# Patient Record
Sex: Male | Born: 1965 | Race: White | Hispanic: No | Marital: Single | Smoking: Current every day smoker
Health system: Southern US, Community
[De-identification: ages and names within clinical notes are randomized; demographics above are authoritative.]

## PROBLEM LIST (undated history)

## (undated) HISTORY — PX: BACK SURGERY: SHX140

---

## 2014-05-20 ENCOUNTER — Emergency Department (HOSPITAL_COMMUNITY)
Admission: EM | Admit: 2014-05-20 | Discharge: 2014-05-20 | Disposition: A | Discharge status: Home / Self Care | Payer: No Typology Code available for payment source | Attending: Emergency Medicine | Admitting: Emergency Medicine

## 2014-05-20 ENCOUNTER — Emergency Department (HOSPITAL_COMMUNITY): Payer: No Typology Code available for payment source

## 2014-05-20 ENCOUNTER — Encounter (HOSPITAL_COMMUNITY): Payer: Self-pay

## 2014-05-20 DIAGNOSIS — S0001XA Abrasion of scalp, initial encounter: Secondary | ICD-10-CM | POA: Insufficient documentation

## 2014-05-20 DIAGNOSIS — Z720 Tobacco use: Secondary | ICD-10-CM | POA: Diagnosis not present

## 2014-05-20 DIAGNOSIS — Y998 Other external cause status: Secondary | ICD-10-CM | POA: Insufficient documentation

## 2014-05-20 DIAGNOSIS — S3992XA Unspecified injury of lower back, initial encounter: Secondary | ICD-10-CM | POA: Insufficient documentation

## 2014-05-20 DIAGNOSIS — Z23 Encounter for immunization: Secondary | ICD-10-CM | POA: Insufficient documentation

## 2014-05-20 DIAGNOSIS — Y9389 Activity, other specified: Secondary | ICD-10-CM | POA: Diagnosis not present

## 2014-05-20 DIAGNOSIS — Z9889 Other specified postprocedural states: Secondary | ICD-10-CM | POA: Diagnosis not present

## 2014-05-20 DIAGNOSIS — S161XXA Strain of muscle, fascia and tendon at neck level, initial encounter: Secondary | ICD-10-CM | POA: Diagnosis not present

## 2014-05-20 DIAGNOSIS — S199XXA Unspecified injury of neck, initial encounter: Secondary | ICD-10-CM | POA: Diagnosis present

## 2014-05-20 DIAGNOSIS — S59902A Unspecified injury of left elbow, initial encounter: Secondary | ICD-10-CM | POA: Insufficient documentation

## 2014-05-20 DIAGNOSIS — Y92410 Unspecified street and highway as the place of occurrence of the external cause: Secondary | ICD-10-CM | POA: Insufficient documentation

## 2014-05-20 MED ORDER — ACETAMINOPHEN 500 MG PO TABS
1000.0000 mg | ORAL_TABLET | Freq: Once | ORAL | Status: AC
  Administered 2014-05-20: 1000 mg via ORAL
  Filled 2014-05-20: qty 2

## 2014-05-20 MED ORDER — TETANUS-DIPHTH-ACELL PERTUSSIS 5-2.5-18.5 LF-MCG/0.5 IM SUSP
0.5000 mL | Freq: Once | INTRAMUSCULAR | Status: AC
  Administered 2014-05-20: 0.5 mL via INTRAMUSCULAR
  Filled 2014-05-20: qty 0.5
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
## 2014-05-20 NOTE — Discharge Instructions (Signed)
Cervical Sprain °A cervical sprain is an injury in the neck in which the strong, fibrous tissues (ligaments) that connect your neck bones stretch or tear. Cervical sprains can range from mild to severe. Severe cervical sprains can cause the neck vertebrae to be unstable. This can lead to damage of the spinal cord and can result in serious nervous system problems. The amount of time it takes for a cervical sprain to get better depends on the cause and extent of the injury. Most cervical sprains heal in 1 to 3 weeks. °CAUSES  °Severe cervical sprains may be caused by:  °· Contact sport injuries (such as from football, rugby, wrestling, hockey, auto racing, gymnastics, diving, martial arts, or boxing).   °· Motor vehicle collisions.   °· Whiplash injuries. This is an injury from a sudden forward and backward whipping movement of the head and neck.  °· Falls.   °Mild cervical sprains may be caused by:  °· Being in an awkward position, such as while cradling a telephone between your ear and shoulder.   °· Sitting in a chair that does not offer proper support.   °· Working at a poorly designed computer station.   °· Looking up or down for long periods of time.   °SYMPTOMS  °· Pain, soreness, stiffness, or a burning sensation in the front, back, or sides of the neck. This discomfort may develop immediately after the injury or slowly, 24 hours or more after the injury.   °· Pain or tenderness directly in the middle of the back of the neck.   °· Shoulder or upper back pain.   °· Limited ability to move the neck.   °· Headache.   °· Dizziness.   °· Weakness, numbness, or tingling in the hands or arms.   °· Muscle spasms.   °· Difficulty swallowing or chewing.   °· Tenderness and swelling of the neck.   °DIAGNOSIS  °Most of the time your health care provider can diagnose a cervical sprain by taking your history and doing a physical exam. Your health care provider will ask about previous neck injuries and any known neck  problems, such as arthritis in the neck. X-rays may be taken to find out if there are any other problems, such as with the bones of the neck. Other tests, such as a CT scan or MRI, may also be needed.  °TREATMENT  °Treatment depends on the severity of the cervical sprain. Mild sprains can be treated with rest, keeping the neck in place (immobilization), and pain medicines. Severe cervical sprains are immediately immobilized. Further treatment is done to help with pain, muscle spasms, and other symptoms and may include: °· Medicines, such as pain relievers, numbing medicines, or muscle relaxants.   °· Physical therapy. This may involve stretching exercises, strengthening exercises, and posture training. Exercises and improved posture can help stabilize the neck, strengthen muscles, and help stop symptoms from returning.   °HOME CARE INSTRUCTIONS  °· Put ice on the injured area.   °¨ Put ice in a plastic bag.   °¨ Place a towel between your skin and the bag.   °¨ Leave the ice on for 15-20 minutes, 3-4 times a day.   °· If your injury was severe, you may have been given a cervical collar to wear. A cervical collar is a two-piece collar designed to keep your neck from moving while it heals. °¨ Do not remove the collar unless instructed by your health care provider. °¨ If you have long hair, keep it outside of the collar. °¨ Ask your health care provider before making any adjustments to your collar. Minor   adjustments may be required over time to improve comfort and reduce pressure on your chin or on the back of your head. °¨ If you are allowed to remove the collar for cleaning or bathing, follow your health care provider's instructions on how to do so safely. °¨ Keep your collar clean by wiping it with mild soap and water and drying it completely. If the collar you have been given includes removable pads, remove them every 1-2 days and hand wash them with soap and water. Allow them to air dry. They should be completely  dry before you wear them in the collar. °¨ If you are allowed to remove the collar for cleaning and bathing, wash and dry the skin of your neck. Check your skin for irritation or sores. If you see any, tell your health care provider. °¨ Do not drive while wearing the collar.   °· Only take over-the-counter or prescription medicines for pain, discomfort, or fever as directed by your health care provider.   °· Keep all follow-up appointments as directed by your health care provider.   °· Keep all physical therapy appointments as directed by your health care provider.   °· Make any needed adjustments to your workstation to promote good posture.   °· Avoid positions and activities that make your symptoms worse.   °· Warm up and stretch before being active to help prevent problems.   °SEEK MEDICAL CARE IF:  °· Your pain is not controlled with medicine.   °· You are unable to decrease your pain medicine over time as planned.   °· Your activity level is not improving as expected.   °SEEK IMMEDIATE MEDICAL CARE IF:  °· You develop any bleeding. °· You develop stomach upset. °· You have signs of an allergic reaction to your medicine.   °· Your symptoms get worse.   °· You develop new, unexplained symptoms.   °· You have numbness, tingling, weakness, or paralysis in any part of your body.   °MAKE SURE YOU:  °· Understand these instructions. °· Will watch your condition. °· Will get help right away if you are not doing well or get worse. °Document Released: 01/04/2007 Document Revised: 03/14/2013 Document Reviewed: 09/14/2012 °Exit[employer]® Patient Information ©2015 [employer]. This information is not intended to replace advice given to you by your health care provider. Make sure you discuss any questions you have with your health care provider. ° °Motor Vehicle Collision °It is common to have multiple bruises and sore muscles after a motor vehicle collision (MVC). These tend to feel worse for the first 24 hours. You may have  the most stiffness and soreness over the first several hours. You may also feel worse when you wake up the first morning after your collision. After this point, you will usually begin to improve with each day. The speed of improvement often depends on the severity of the collision, the number of injuries, and the location and nature of these injuries. °HOME CARE INSTRUCTIONS °· Put ice on the injured area. °¨ Put ice in a plastic bag. °¨ Place a towel between your skin and the bag. °¨ Leave the ice on for 15-20 minutes, 3-4 times a day, or as directed by your health care provider. °· Drink enough fluids to keep your urine clear or pale yellow. Do not drink alcohol. °· Take a warm shower or bath once or twice a day. This will increase blood flow to sore muscles. °· You may return to activities as directed by your caregiver. Be careful when lifting, as this may aggravate neck or back   pain. °· Only take over-the-counter or prescription medicines for pain, discomfort, or fever as directed by your caregiver. Do not use aspirin. This may increase bruising and bleeding. °SEEK IMMEDIATE MEDICAL CARE IF: °· You have numbness, tingling, or weakness in the arms or legs. °· You develop severe headaches not relieved with medicine. °· You have severe neck pain, especially tenderness in the middle of the back of your neck. °· You have changes in bowel or bladder control. °· There is increasing pain in any area of the body. °· You have shortness of breath, light-headedness, dizziness, or fainting. °· You have chest pain. °· You feel sick to your stomach (nauseous), throw up (vomit), or sweat. °· You have increasing abdominal discomfort. °· There is blood in your urine, stool, or vomit. °· You have pain in your shoulder (shoulder strap areas). °· You feel your symptoms are getting worse. °MAKE SURE YOU: °· Understand these instructions. °· Will watch your condition. °· Will get help right away if you are not doing well or get  worse. °Document Released: 03/09/2005 Document Revised: 07/24/2013 Document Reviewed: 08/06/2010 °ExitCare® Patient Information ©2015 [employer]. This information is not intended to replace advice given to you by your health care provider. Make sure you discuss any questions you have with your health care provider. ° °
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
## 2014-05-21 ENCOUNTER — Encounter (HOSPITAL_COMMUNITY): Payer: Self-pay | Admitting: *Deleted

## 2014-05-21 ENCOUNTER — Emergency Department (HOSPITAL_COMMUNITY)
Admission: EM | Admit: 2014-05-21 | Discharge: 2014-05-21 | Disposition: A | Discharge status: Home / Self Care | Payer: No Typology Code available for payment source | Attending: Emergency Medicine | Admitting: Emergency Medicine

## 2014-05-21 DIAGNOSIS — G8911 Acute pain due to trauma: Secondary | ICD-10-CM | POA: Diagnosis not present

## 2014-05-21 DIAGNOSIS — S161XXD Strain of muscle, fascia and tendon at neck level, subsequent encounter: Secondary | ICD-10-CM | POA: Insufficient documentation

## 2014-05-21 DIAGNOSIS — Z720 Tobacco use: Secondary | ICD-10-CM | POA: Diagnosis not present

## 2014-05-21 DIAGNOSIS — M542 Cervicalgia: Secondary | ICD-10-CM | POA: Diagnosis present

## 2014-05-21 MED ORDER — CYCLOBENZAPRINE HCL 10 MG PO TABS: 10.0000 mg | ORAL_TABLET | Freq: Two times a day (BID) | ORAL | Status: AC | PRN

## 2014-05-21 MED ORDER — IBUPROFEN 800 MG PO TABS: 800.0000 mg | ORAL_TABLET | Freq: Three times a day (TID) | ORAL | Status: AC

## 2014-05-21 MED ORDER — HYDROCODONE-ACETAMINOPHEN 5-325 MG PO TABS
1.0000 | ORAL_TABLET | ORAL | Status: AC | PRN
Start: 2014-05-21 — End: ?
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
## 2014-05-21 NOTE — Discharge Instructions (Signed)
Cervical Sprain A cervical sprain is when the tissues (ligaments) that hold the neck bones in place stretch or tear. HOME CARE   Put ice on the injured area.  Put ice in a plastic bag.  Place a towel between your skin and the bag.  Leave the ice on for 15-20 minutes, 3-4 times a day.  You may have been given a collar to wear. This collar keeps your neck from moving while you heal.  Do not take the collar off unless told by your doctor.  If you have long hair, keep it outside of the collar.  Ask your doctor before changing the position of your collar. You may need to change its position over time to make it more comfortable.  If you are allowed to take off the collar for cleaning or bathing, follow your doctor's instructions on how to do it safely.  Keep your collar clean by wiping it with mild soap and water. Dry it completely. If the collar has removable pads, remove them every 1-2 days to hand wash them with soap and water. Allow them to air dry. They should be dry before you wear them in the collar.  Do not drive while wearing the collar.  Only take medicine as told by your doctor.  Keep all doctor visits as told.  Keep all physical therapy visits as told.  Adjust your work station so that you have good posture while you work.  Avoid positions and activities that make your problems worse.  Warm up and stretch before being active. GET HELP IF:  Your pain is not controlled with medicine.  You cannot take less pain medicine over time as planned.  Your activity level does not improve as expected. GET HELP RIGHT AWAY IF:   You are bleeding.  Your stomach is upset.  You have an allergic reaction to your medicine.  You develop new problems that you cannot explain.  You lose feeling (become numb) or you cannot move any part of your body (paralysis).  You have tingling or weakness in any part of your body.  Your symptoms get worse. Symptoms include:  Pain,  soreness, stiffness, puffiness (swelling), or a burning feeling in your neck.  Pain when your neck is touched.  Shoulder or upper back pain.  Limited ability to move your neck.  Headache.  Dizziness.  Your hands or arms feel week, lose feeling, or tingle.  Muscle spasms.  Difficulty swallowing or chewing. MAKE SURE YOU:   Understand these instructions.  Will watch your condition.  Will get help right away if you are not doing well or get worse. Document Released: 08/26/2007 Document Revised: 11/09/2012 Document Reviewed: 09/14/2012 Exit[employer] Patient Information 2015 [employer]. This information is not intended to replace advice given to you by your health care provider. Make sure you discuss any questions you have with your health care provider.  
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
